# Patient Record
Sex: Male | Born: 1980 | Race: Black or African American | Hispanic: No | Marital: Single
Health system: Midwestern US, Community
[De-identification: ages and names within clinical notes are randomized; demographics above are authoritative.]

## PROBLEM LIST (undated history)

## (undated) HISTORY — PX: TIBIA FRACTURE SURGERY: SHX806

---

## 2014-06-01 ENCOUNTER — Emergency Department: Payer: Self-pay | Admitting: Emergency Medicine

## 2016-04-04 ENCOUNTER — Emergency Department: Payer: No Typology Code available for payment source

## 2016-04-04 ENCOUNTER — Encounter: Payer: Self-pay | Admitting: Emergency Medicine

## 2016-04-04 ENCOUNTER — Emergency Department
Admission: EM | Admit: 2016-04-04 | Discharge: 2016-04-04 | Disposition: A | Discharge status: Home / Self Care | Payer: No Typology Code available for payment source | Attending: Emergency Medicine | Admitting: Emergency Medicine

## 2016-04-04 DIAGNOSIS — S8001XA Contusion of right knee, initial encounter: Secondary | ICD-10-CM | POA: Diagnosis not present

## 2016-04-04 DIAGNOSIS — Y92410 Unspecified street and highway as the place of occurrence of the external cause: Secondary | ICD-10-CM | POA: Diagnosis not present

## 2016-04-04 DIAGNOSIS — F17200 Nicotine dependence, unspecified, uncomplicated: Secondary | ICD-10-CM | POA: Insufficient documentation

## 2016-04-04 DIAGNOSIS — Y939 Activity, unspecified: Secondary | ICD-10-CM | POA: Diagnosis not present

## 2016-04-04 DIAGNOSIS — Y999 Unspecified external cause status: Secondary | ICD-10-CM | POA: Diagnosis not present

## 2016-04-04 DIAGNOSIS — S8991XA Unspecified injury of right lower leg, initial encounter: Secondary | ICD-10-CM | POA: Diagnosis present

## 2016-04-04 MED ORDER — OXYCODONE-ACETAMINOPHEN 7.5-325 MG PO TABS
1.0000 | ORAL_TABLET | Freq: Once | ORAL | Status: AC
  Administered 2016-04-04: 1 via ORAL
  Filled 2016-04-04: qty 1

## 2016-04-04 MED ORDER — OXYCODONE-ACETAMINOPHEN 5-325 MG PO TABS: 1.0000 | ORAL_TABLET | Freq: Three times a day (TID) | ORAL | 0 refills | Status: AC | PRN

## 2016-04-04 NOTE — Discharge Instructions (Signed)
Your exam and x-ray does not reveal any obvious, new fracture. Continue to ambulate with crutches, rest with the leg elevated, and apply ice to reduce swelling. Follow-up with Dr. Luciano CutterHahn for further evaluation and management.
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
## 2016-04-15 ENCOUNTER — Encounter: Payer: Self-pay | Admitting: Emergency Medicine

## 2016-04-15 ENCOUNTER — Emergency Department
Admission: EM | Admit: 2016-04-15 | Discharge: 2016-04-15 | Disposition: A | Discharge status: Home / Self Care | Payer: No Typology Code available for payment source | Attending: Emergency Medicine | Admitting: Emergency Medicine

## 2016-04-15 ENCOUNTER — Emergency Department: Payer: No Typology Code available for payment source

## 2016-04-15 DIAGNOSIS — F17200 Nicotine dependence, unspecified, uncomplicated: Secondary | ICD-10-CM | POA: Diagnosis not present

## 2016-04-15 DIAGNOSIS — M7989 Other specified soft tissue disorders: Secondary | ICD-10-CM | POA: Diagnosis present

## 2016-04-15 DIAGNOSIS — M79604 Pain in right leg: Secondary | ICD-10-CM | POA: Diagnosis not present

## 2016-04-15 DIAGNOSIS — Z79899 Other long term (current) drug therapy: Secondary | ICD-10-CM | POA: Diagnosis not present

## 2016-04-15 DIAGNOSIS — T1490XA Injury, unspecified, initial encounter: Secondary | ICD-10-CM

## 2016-04-15 MED ORDER — OXYCODONE-ACETAMINOPHEN 5-325 MG PO TABS
1.0000 | ORAL_TABLET | Freq: Once | ORAL | Status: AC
  Administered 2016-04-15: 1 via ORAL
  Filled 2016-04-15: qty 1
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
## 2016-04-27 ENCOUNTER — Encounter: Payer: Self-pay | Admitting: Emergency Medicine

## 2016-04-27 ENCOUNTER — Emergency Department
Admission: EM | Admit: 2016-04-27 | Discharge: 2016-04-27 | Disposition: A | Discharge status: Home / Self Care | Payer: Self-pay | Attending: Emergency Medicine | Admitting: Emergency Medicine

## 2016-04-27 DIAGNOSIS — F17200 Nicotine dependence, unspecified, uncomplicated: Secondary | ICD-10-CM | POA: Insufficient documentation

## 2016-04-27 DIAGNOSIS — K529 Noninfective gastroenteritis and colitis, unspecified: Secondary | ICD-10-CM

## 2016-04-27 DIAGNOSIS — Z79899 Other long term (current) drug therapy: Secondary | ICD-10-CM | POA: Insufficient documentation

## 2016-04-27 LAB — COMPREHENSIVE METABOLIC PANEL
ALT: 18 U/L (ref 17–63)
ANION GAP: 7 (ref 5–15)
AST: 24 U/L (ref 15–41)
Albumin: 4.4 g/dL (ref 3.5–5.0)
Alkaline Phosphatase: 93 U/L (ref 38–126)
BUN: 7 mg/dL (ref 6–20)
CALCIUM: 9.3 mg/dL (ref 8.9–10.3)
CHLORIDE: 104 mmol/L (ref 101–111)
CO2: 26 mmol/L (ref 22–32)
CREATININE: 0.93 mg/dL (ref 0.61–1.24)
Glucose, Bld: 118 mg/dL — ABNORMAL HIGH (ref 65–99)
Potassium: 4.5 mmol/L (ref 3.5–5.1)
Sodium: 137 mmol/L (ref 135–145)
Total Bilirubin: 0.6 mg/dL (ref 0.3–1.2)
Total Protein: 7.9 g/dL (ref 6.5–8.1)

## 2016-04-27 LAB — CBC WITH DIFFERENTIAL/PLATELET
Basophils Absolute: 0.1 10*3/uL (ref 0–0.1)
Basophils Relative: 1 %
EOS PCT: 0 %
Eosinophils Absolute: 0 10*3/uL (ref 0–0.7)
HCT: 41.9 % (ref 40.0–52.0)
Hemoglobin: 14 g/dL (ref 13.0–18.0)
LYMPHS ABS: 1.2 10*3/uL (ref 1.0–3.6)
LYMPHS PCT: 9 %
MCH: 29.1 pg (ref 26.0–34.0)
MCHC: 33.5 g/dL (ref 32.0–36.0)
MCV: 86.8 fL (ref 80.0–100.0)
MONO ABS: 0.2 10*3/uL (ref 0.2–1.0)
MONOS PCT: 2 %
Neutro Abs: 11.9 10*3/uL — ABNORMAL HIGH (ref 1.4–6.5)
Neutrophils Relative %: 88 %
PLATELETS: 336 10*3/uL (ref 150–440)
RBC: 4.82 MIL/uL (ref 4.40–5.90)
RDW: 14.3 % (ref 11.5–14.5)
WBC: 13.5 10*3/uL — AB (ref 3.8–10.6)

## 2016-04-27 LAB — LIPASE, BLOOD: LIPASE: 19 U/L (ref 11–51)

## 2016-04-27 MED ORDER — ONDANSETRON HCL 4 MG PO TABS: 4.0000 mg | ORAL_TABLET | Freq: Three times a day (TID) | ORAL | 0 refills | Status: AC | PRN

## 2016-04-27 MED ORDER — SODIUM CHLORIDE 0.9 % IV BOLUS (SEPSIS)
1000.0000 mL | Freq: Once | INTRAVENOUS | Status: AC
  Administered 2016-04-27: 1000 mL via INTRAVENOUS

## 2016-04-27 MED ORDER — ONDANSETRON HCL 4 MG/2ML IJ SOLN
4.0000 mg | Freq: Once | INTRAMUSCULAR | Status: AC
  Administered 2016-04-27: 4 mg via INTRAVENOUS

## 2016-04-27 MED ORDER — DICYCLOMINE HCL 10 MG/ML IM SOLN
20.0000 mg | Freq: Once | INTRAMUSCULAR | Status: AC
  Administered 2016-04-27: 20 mg via INTRAMUSCULAR
  Filled 2016-04-27 (×2): qty 2

## 2016-04-27 MED ORDER — DICYCLOMINE HCL 20 MG PO TABS: 20.0000 mg | ORAL_TABLET | Freq: Three times a day (TID) | ORAL | 0 refills | Status: AC | PRN

## 2016-04-27 MED ORDER — ONDANSETRON HCL 4 MG/2ML IJ SOLN
INTRAMUSCULAR | Status: AC
  Filled 2016-04-27: qty 2

## 2016-04-27 MED ORDER — ONDANSETRON HCL 4 MG/2ML IJ SOLN
4.0000 mg | Freq: Once | INTRAMUSCULAR | Status: AC
  Administered 2016-04-27: 4 mg via INTRAVENOUS
  Filled 2016-04-27: qty 2

## 2016-04-27 NOTE — ED Notes (Signed)
Attempted IVx1. Unsuccessful.

## 2016-04-27 NOTE — ED Provider Notes (Signed)
[facility] Emergency Department Provider Note   ____________________________________________   I have reviewed the triage vital signs and the nursing notes.   HISTORY  Chief Complaint Abdominal Pain; Nausea; and Emesis   History limited by: Not Limited   HPI Shawn Lucas is a 35 y.o. male who presents to the emergency department today because of concerns for nausea vomiting and abdominal pain. The symptoms started roughly 6 hours ago. They did wake him from sleep. He has had multiple episodes of nonbloody vomiting. He has not had any associated diarrhea. He did have an episode of defecation which was normal to him. He is also describing somewhat diffuse abdominal pain that is sharp. He denies any known sick contacts. Denies any unusual ingestions.   History reviewed. No pertinent past medical history.  There are no active problems to display for this patient.   Past Surgical History:  Procedure Laterality Date  . TIBIA FRACTURE SURGERY      Prior to Admission medications   Medication Sig Start Date End Date Taking? Authorizing Provider  oxyCODONE-acetaminophen (ROXICET) 5-325 MG tablet Take 1 tablet by mouth every 8 (eight) hours as needed for moderate pain or severe pain. 04/04/16   Charlesetta IvoryJenise V Bacon Menshew, PA-C    Allergies Patient has no known allergies.  No family history on file.  Social History Social History  Substance Use Topics  . Smoking status: Current Every Day Smoker  . Smokeless tobacco: Never Used  . Alcohol use No    Review of Systems  Constitutional: Negative for fever. Cardiovascular: Negative for chest pain. Respiratory: Negative for shortness of breath. Gastrointestinal: Positive for abdominal pain, nausea and vomiting. Genitourinary: Negative for dysuria. Musculoskeletal: Negative for back pain. Skin: Negative for rash. Neurological: Negative for headaches, focal weakness or numbness.  10-point ROS otherwise  negative.  ____________________________________________   PHYSICAL EXAM:  VITAL SIGNS: ED Triage Vitals [04/27/16 0901]  Enc Vitals Group     BP (!) 151/79     Pulse Rate 64     Resp 18     Temp 97.5 F (36.4 C)     Temp Source Oral     SpO2 100 %     Weight 175 lb (79.4 kg)     Height 5\' 9"  (1.753 m)     Head Circumference      Peak Flow      Pain Score 10    Constitutional: Alert and oriented. Well appearing and in no distress. Eyes: Conjunctivae are normal. Normal extraocular movements. ENT   Head: Normocephalic and atraumatic.   Nose: No congestion/rhinnorhea.   Mouth/Throat: Mucous membranes are moist.   Neck: No stridor. Hematological/Lymphatic/Immunilogical: No cervical lymphadenopathy. Cardiovascular: Normal rate, regular rhythm.  No murmurs, rubs, or gallops. Respiratory: Normal respiratory effort without tachypnea nor retractions. Breath sounds are clear and equal bilaterally. No wheezes/rales/rhonchi. Gastrointestinal: Soft and non tender. No rebound. No guarding.  Genitourinary: Deferred Musculoskeletal: Normal range of motion in all extremities. No lower extremity edema. Neurologic:  Normal speech and language. No gross focal neurologic deficits are appreciated.  Skin:  Skin is warm, dry and intact. No rash noted. Psychiatric: Mood and affect are normal. Speech and behavior are normal. Patient exhibits appropriate insight and judgment.  ____________________________________________    LABS (pertinent positives/negatives)  Labs Reviewed  CBC WITH DIFFERENTIAL/PLATELET - Abnormal; Notable for the following:       Result Value   WBC 13.5 (*)    Neutro Abs 11.9 (*)  All other components within normal limits  COMPREHENSIVE METABOLIC PANEL - Abnormal; Notable for the following:    Glucose, Bld 118 (*)    All other components within normal limits  LIPASE, BLOOD      ____________________________________________   EKG  None  ____________________________________________    RADIOLOGY  None  ____________________________________________   PROCEDURES  Procedures  ____________________________________________   INITIAL IMPRESSION / ASSESSMENT AND PLAN / ED COURSE  Pertinent labs & imaging results that were available during my care of the patient were reviewed by me and considered in my medical decision making (see chart for details).  Patient presented to the emergency department today with concerns for nausea vomiting and abdominal pain. Patient's blood work without any concerning elevation of Function panel, lipase were white blood cells. Patient did feel better after IV fluids and antiemetics. At this point think viral gastroenteritis likely. I doubt significant intra-abdominal infection or complication. Will plan on discharging home with Bentyl and Zofran.  ____________________________________________   FINAL CLINICAL IMPRESSION(S) / ED DIAGNOSES  Final diagnoses:  Gastroenteritis     Note: This dictation was prepared with Dragon dictation. Any transcriptional errors that result from this process are unintentional     Phineas SemenGraydon Adalynd Donahoe, MD 04/27/16 1237

## 2016-04-27 NOTE — ED Triage Notes (Signed)
Pt presents with abd pain, n/v started last night.

## 2016-04-27 NOTE — Discharge Instructions (Signed)
Please seek medical attention for any high fevers, chest pain, shortness of breath, change in behavior, persistent vomiting, bloody stool or any other new or concerning symptoms.  

## 2016-04-27 NOTE — ED Notes (Signed)
Offered to wheel patient out, wife declined said she would be fine.

## 2016-04-27 NOTE — ED Notes (Signed)
Redone lab collection. Sent labs down.

## 2016-08-27 ENCOUNTER — Emergency Department
Admission: EM | Admit: 2016-08-27 | Discharge: 2016-08-27 | Disposition: A | Discharge status: Home / Self Care | Payer: Self-pay | Attending: Emergency Medicine | Admitting: Emergency Medicine

## 2016-08-27 ENCOUNTER — Encounter: Payer: Self-pay | Admitting: Emergency Medicine

## 2016-08-27 ENCOUNTER — Emergency Department: Payer: Self-pay

## 2016-08-27 DIAGNOSIS — F17200 Nicotine dependence, unspecified, uncomplicated: Secondary | ICD-10-CM | POA: Insufficient documentation

## 2016-08-27 DIAGNOSIS — K529 Noninfective gastroenteritis and colitis, unspecified: Secondary | ICD-10-CM | POA: Insufficient documentation

## 2016-08-27 LAB — CBC
HCT: 41.6 % (ref 40.0–52.0)
Hemoglobin: 13.7 g/dL (ref 13.0–18.0)
MCH: 29.7 pg (ref 26.0–34.0)
MCHC: 33 g/dL (ref 32.0–36.0)
MCV: 89.8 fL (ref 80.0–100.0)
PLATELETS: 423 10*3/uL (ref 150–440)
RBC: 4.63 MIL/uL (ref 4.40–5.90)
RDW: 13.2 % (ref 11.5–14.5)
WBC: 15.1 10*3/uL — ABNORMAL HIGH (ref 3.8–10.6)

## 2016-08-27 LAB — COMPREHENSIVE METABOLIC PANEL
ALBUMIN: 4.1 g/dL (ref 3.5–5.0)
ALK PHOS: 100 U/L (ref 38–126)
ALT: 26 U/L (ref 17–63)
ANION GAP: 11 (ref 5–15)
AST: 27 U/L (ref 15–41)
BUN: 9 mg/dL (ref 6–20)
CHLORIDE: 102 mmol/L (ref 101–111)
CO2: 25 mmol/L (ref 22–32)
CREATININE: 1.03 mg/dL (ref 0.61–1.24)
Calcium: 9.3 mg/dL (ref 8.9–10.3)
GFR calc non Af Amer: >60 mL/min (ref >60)
Glucose, Bld: 156 mg/dL — ABNORMAL HIGH (ref 65–99)
POTASSIUM: 3.7 mmol/L (ref 3.5–5.1)
SODIUM: 138 mmol/L (ref 135–145)
TOTAL PROTEIN: 7.5 g/dL (ref 6.5–8.1)
Total Bilirubin: 0.6 mg/dL (ref 0.3–1.2)

## 2016-08-27 LAB — URINALYSIS, COMPLETE (UACMP) WITH MICROSCOPIC
Bilirubin Urine: NEGATIVE
GLUCOSE, UA: NEGATIVE mg/dL
Hgb urine dipstick: NEGATIVE
Ketones, ur: 20 mg/dL — AB
Nitrite: NEGATIVE
PH: 7 (ref 5.0–8.0)
PROTEIN: 30 mg/dL — AB
Specific Gravity, Urine: 1.024 (ref 1.005–1.030)
Squamous Epithelial / LPF: NONE SEEN

## 2016-08-27 LAB — LIPASE, BLOOD: LIPASE: 38 U/L (ref 11–51)

## 2016-08-27 LAB — TROPONIN I: Troponin I: <0.03 ng/mL (ref <0.03)

## 2016-08-27 MED ORDER — ONDANSETRON HCL 4 MG/2ML IJ SOLN
4.0000 mg | Freq: Once | INTRAMUSCULAR | Status: AC | PRN
  Administered 2016-08-27: 4 mg via INTRAVENOUS

## 2016-08-27 MED ORDER — MORPHINE SULFATE (PF) 4 MG/ML IV SOLN
INTRAVENOUS | Status: DC
Start: 2016-08-27 — End: 2016-08-27
  Filled 2016-08-27: qty 1

## 2016-08-27 MED ORDER — IOPAMIDOL (ISOVUE-300) INJECTION 61%
100.0000 mL | Freq: Once | INTRAVENOUS | Status: AC | PRN
  Administered 2016-08-27: 100 mL via INTRAVENOUS

## 2016-08-27 MED ORDER — CIPROFLOXACIN HCL 500 MG PO TABS: 500.0000 mg | ORAL_TABLET | Freq: Two times a day (BID) | ORAL | 0 refills | Status: AC

## 2016-08-27 MED ORDER — SODIUM CHLORIDE 0.9 % IV BOLUS (SEPSIS)
1000.0000 mL | Freq: Once | INTRAVENOUS | Status: AC
  Administered 2016-08-27: 1000 mL via INTRAVENOUS

## 2016-08-27 MED ORDER — OXYCODONE-ACETAMINOPHEN 5-325 MG PO TABS: 1.0000 | ORAL_TABLET | Freq: Four times a day (QID) | ORAL | 0 refills | Status: AC | PRN

## 2016-08-27 MED ORDER — METRONIDAZOLE IN NACL 5-0.79 MG/ML-% IV SOLN
500.0000 mg | Freq: Once | INTRAVENOUS | Status: AC
  Administered 2016-08-27: 500 mg via INTRAVENOUS
  Filled 2016-08-27: qty 100

## 2016-08-27 MED ORDER — OXYCODONE-ACETAMINOPHEN 5-325 MG PO TABS
1.0000 | ORAL_TABLET | Freq: Once | ORAL | Status: AC
Start: 2016-08-27 — End: 2016-08-27
  Administered 2016-08-27: 1 via ORAL
  Filled 2016-08-27: qty 1

## 2016-08-27 MED ORDER — CIPROFLOXACIN IN D5W 400 MG/200ML IV SOLN
400.0000 mg | Freq: Once | INTRAVENOUS | Status: AC
  Administered 2016-08-27: 400 mg via INTRAVENOUS
  Filled 2016-08-27: qty 200

## 2016-08-27 MED ORDER — IOPAMIDOL (ISOVUE-300) INJECTION 61%
30.0000 mL | Freq: Once | INTRAVENOUS | Status: AC
  Administered 2016-08-27: 30 mL via ORAL

## 2016-08-27 MED ORDER — METRONIDAZOLE 500 MG PO TABS
500.0000 mg | ORAL_TABLET | Freq: Two times a day (BID) | ORAL | 0 refills | Status: AC
Start: 2016-08-27 — End: 2016-09-03

## 2016-08-27 MED ORDER — MORPHINE SULFATE (PF) 4 MG/ML IV SOLN
4.0000 mg | Freq: Once | INTRAVENOUS | Status: AC
  Administered 2016-08-27: 4 mg via INTRAVENOUS

## 2016-08-27 MED ORDER — ONDANSETRON HCL 4 MG/2ML IJ SOLN
INTRAMUSCULAR | Status: AC
  Filled 2016-08-27: qty 2

## 2016-08-27 NOTE — ED Triage Notes (Signed)
Abdominal pain for 4 days, worse over the past 5 hours.  +nausea, vomiting and diarrhea.

## 2016-08-27 NOTE — ED Provider Notes (Signed)
[facility] Emergency Department Provider Note   ____________________________________________   First MD Initiated Contact with Patient 08/27/16 [phone]     (approximate)  I have reviewed the triage vital signs and the nursing notes.   HISTORY  Chief Complaint Abdominal Pain    HPI Shawn Lucas is a 36 y.o. male who comes into the hospital today with abdominal pain for the past 4 days. The patient has had pain along with vomiting and diarrhea. It is all over his abdomen. He reports that it seems worse today. He felt like his chest was going to [place] in and stated that his chest was feeling tight when he came in here tonight. The patient has not had any fevers but did break into a sweat. Now the patient is having some shaking chills. He states that the pain varies but currently it is a 10 out of 10 in intensity. The patient has never had this pain before and denies any sick contacts. He recently started on an anti-inflammatory medication that they do not remember the name of. He has not taken any other medication at home. He is here today for evaluation.   History reviewed. No pertinent past medical history.  There are no active problems to display for this patient.   Past Surgical History:  Procedure Laterality Date  . TIBIA FRACTURE SURGERY      Prior to Admission medications   Medication Sig Start Date End Date Taking? Authorizing Provider  ciprofloxacin (CIPRO) 500 MG tablet Take 1 tablet (500 mg total) by mouth 2 (two) times daily. 08/27/16 09/03/16  Rebecka Apley, MD  dicyclomine (BENTYL) 20 MG tablet Take 1 tablet (20 mg total) by mouth 3 (three) times daily as needed (abdominal pain). 04/27/16   Phineas Semen, MD  metroNIDAZOLE (FLAGYL) 500 MG tablet Take 1 tablet (500 mg total) by mouth 2 (two) times daily. 08/27/16 09/03/16  Rebecka Apley, MD  ondansetron (ZOFRAN) 4 MG tablet Take 1 tablet (4 mg total) by mouth every 8 (eight) hours as  needed for nausea or vomiting. 04/27/16   Phineas Semen, MD  oxyCODONE-acetaminophen (ROXICET) 5-325 MG tablet Take 1 tablet by mouth every 8 (eight) hours as needed for moderate pain or severe pain. 04/04/16   Jenise V Bacon Menshew, PA-C  oxyCODONE-acetaminophen (ROXICET) 5-325 MG tablet Take 1 tablet by mouth every 6 (six) hours as needed. 08/27/16   Rebecka Apley, MD    Allergies Patient has no known allergies.  No family history on file.  Social History Social History  Substance Use Topics  . Smoking status: Current Every Day Smoker    Packs/day: 0.50  . Smokeless tobacco: Never Used  . Alcohol use No    Review of Systems  Constitutional: No fever/chills Eyes: No visual changes. ENT: No sore throat. Cardiovascular: Denies chest pain. Respiratory: Denies shortness of breath. Gastrointestinal:  abdominal pain.   nausea,  vomiting.   diarrhea.  No constipation. Genitourinary: Negative for dysuria. Musculoskeletal: Negative for back pain. Skin: Negative for rash. Neurological: Negative for headaches, focal weakness or numbness.   ____________________________________________   PHYSICAL EXAM:  VITAL SIGNS: ED Triage Vitals  Enc Vitals Group     BP 08/27/16 0233 (!) 145/107     Pulse Rate 08/27/16 0201 72     Resp 08/27/16 0201 (!) 26     Temp 08/27/16 0201 97.4 F (36.3 C)     Temp Source 08/27/16 0201 Oral     SpO2 08/27/16  0201 100 %     Weight 08/27/16 0208 270 lb (122.5 kg)     Height 08/27/16 0208  (1.753 m)     Head Circumference --      Peak Flow --      Pain Score --      Pain Loc --      Pain Edu? --      Excl. in GC? --     Constitutional: Alert and oriented. Well appearing and in Moderate distress. Eyes: Conjunctivae are normal. PERRL. EOMI. Head: Atraumatic. Nose: No congestion/rhinnorhea. Mouth/Throat: Mucous membranes are moist.  Oropharynx non-erythematous. Cardiovascular: Normal rate, regular rhythm. Grossly normal heart sounds.   Good peripheral circulation. Respiratory: Normal respiratory effort.  No retractions. Lungs CTAB. Gastrointestinal: Soft and nontender. No distention. Positive bowel sounds Musculoskeletal: No lower extremity tenderness nor edema.   Neurologic:  Normal speech and language.  Skin:  Skin is warm, dry and intact. Marland Kitchen Psychiatric: Mood and affect are normal.   ____________________________________________   LABS (all labs ordered are listed, but only abnormal results are displayed)  Labs Reviewed  COMPREHENSIVE METABOLIC PANEL - Abnormal; Notable for the following:       Result Value   Glucose, Bld 156 (*)    All other components within normal limits  CBC - Abnormal; Notable for the following:    WBC 15.1 (*)    All other components within normal limits  URINALYSIS, COMPLETE (UACMP) WITH MICROSCOPIC - Abnormal; Notable for the following:    Color, Urine YELLOW (*)    APPearance TURBID (*)    Ketones, ur 20 (*)    Protein, ur 30 (*)    Leukocytes, UA TRACE (*)    Bacteria, UA RARE (*)    All other components within normal limits  LIPASE, BLOOD  TROPONIN I   ____________________________________________  EKG  ED ECG REPORT I, Rebecka Apley, the attending physician, personally viewed and interpreted this ECG.   Date: 08/27/2016  EKG Time: 208  Rate: 73  Rhythm: normal sinus rhythm  Axis: normal  Intervals:none  ST&T Change: none  ____________________________________________  RADIOLOGY  CT abdomen and pelvis ____________________________________________   PROCEDURES  Procedure(s) performed: None  Procedures  Critical Care performed: No  ____________________________________________   INITIAL IMPRESSION / ASSESSMENT AND PLAN / ED COURSE  Pertinent labs & imaging results that were available during my care of the patient were reviewed by me and considered in my medical decision making (see chart for details).  This is a 36 year old male who comes into the  hospital today with some abdominal pain. The patient is also had some vomiting and diarrhea. He is very uncomfortable and did receive some morphine and Zofran when he initially arrived. I will give the patient a second dose of morphine as well as a liter of normal saline. The patient has a white blood cell count of 15 so I will send him for a CT scan to evaluate the cause of his pain.  Clinical Course as of Aug 27 699  Sun Aug 27, 2016  0515 Colitis, likely infectious in etiology. Correlation with clinical exam and stool cultures recommended. No bowel obstruction. Normal appendix.   CT Abdomen Pelvis W Contrast [AW]    Clinical Course User Index [AW] Rebecka Apley, MD   The patient did receive some ciprofloxacin and Flagyl for his colitis. He also received a dose of Percocet orally. He'll be discharged home to follow-up with his primary care physician or the  acute care clinic.  ____________________________________________   FINAL CLINICAL IMPRESSION(S) / ED DIAGNOSES  Final diagnoses:  Colitis      NEW MEDICATIONS STARTED DURING THIS VISIT:  New Prescriptions   CIPROFLOXACIN (CIPRO) 500 MG TABLET    Take 1 tablet (500 mg total) by mouth 2 (two) times daily.   METRONIDAZOLE (FLAGYL) 500 MG TABLET    Take 1 tablet (500 mg total) by mouth 2 (two) times daily.   OXYCODONE-ACETAMINOPHEN (ROXICET) 5-325 MG TABLET    Take 1 tablet by mouth every 6 (six) hours as needed.     Note:  This document was prepared using Dragon voice recognition software and may include unintentional dictation errors.    Rebecka Apley, MD 08/27/16 [phone]

## 2016-08-27 NOTE — ED Notes (Signed)
Unable to obtain blood pressure because patient unable to stay still.

## 2016-08-27 NOTE — Discharge Instructions (Signed)
Please follow up with the acute care clinic °

## 2017-07-18 IMAGING — CT CT ABD-PELV W/ CM
2 of 7 series · 14 of 46 positions shown, 18 images · IV contrast (APPLIED)
Comparison: None.

CLINICAL DATA: 35-year-old male with diffuse abdominal pain and
vomiting and diarrhea.

EXAM:
CT ABDOMEN AND PELVIS WITH CONTRAST
TECHNIQUE: Multidetector CT imaging of the abdomen and pelvis was performed
using the standard protocol following bolus administration of
intravenous contrast.
CONTRAST:  100mL [id number] IOPAMIDOL ([id number]) INJECTION 61%

[Series 2: routine abd/pel with · axial · 0.88mm/px · z∈[-936,-516]mm · 11 of 98 slices shown, 15 images]
[im 9/98  soft-tissue]
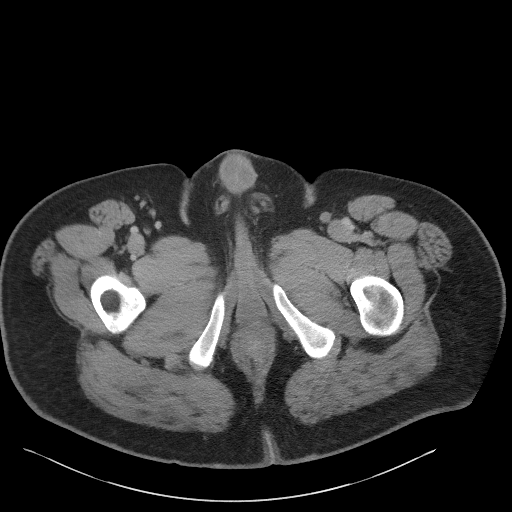
[im 9/98  bone]
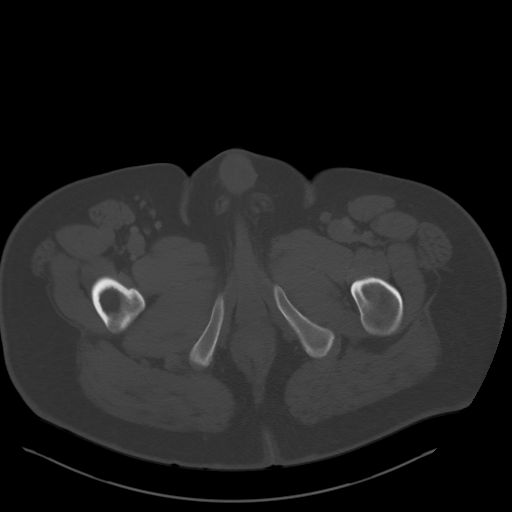
[im 18/98  soft-tissue]
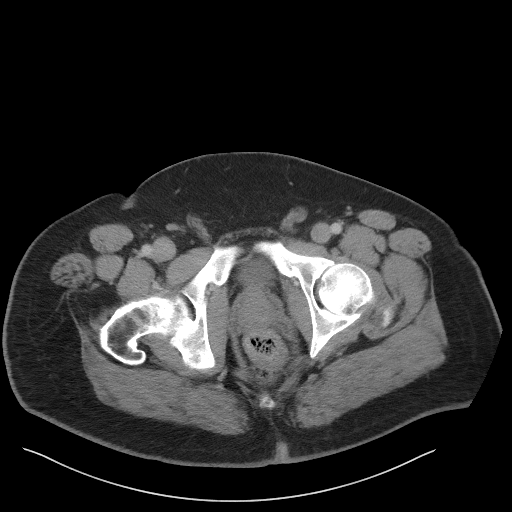
[im 27/98  soft-tissue]
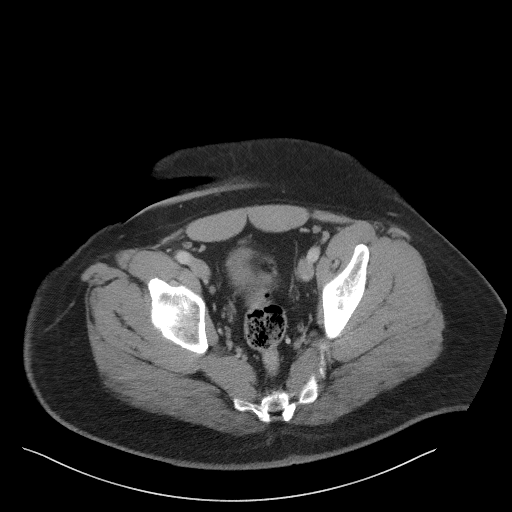
[im 40/98  soft-tissue]
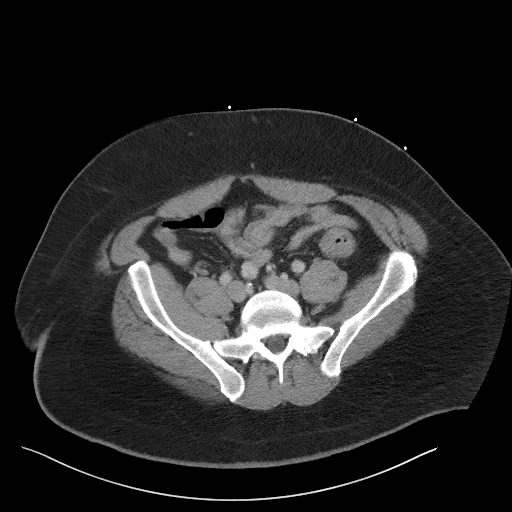
[im 49/98  soft-tissue]
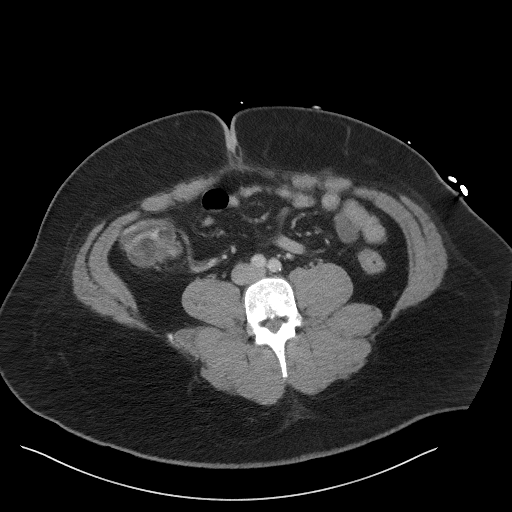
[im 58/98  soft-tissue]
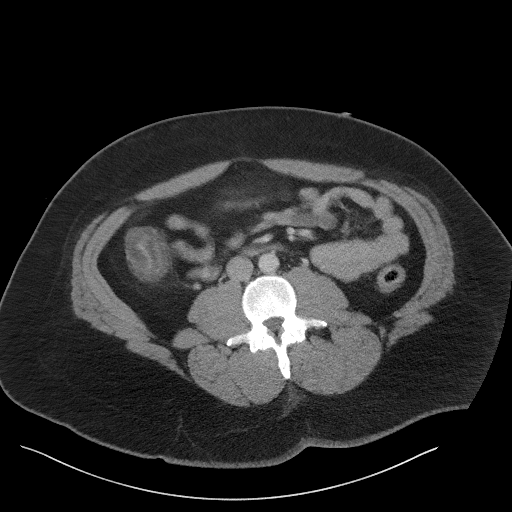
[im 71/98  soft-tissue]
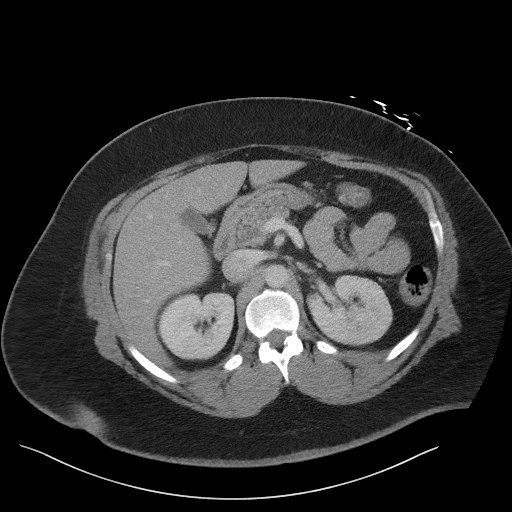
[im 80/98  soft-tissue]
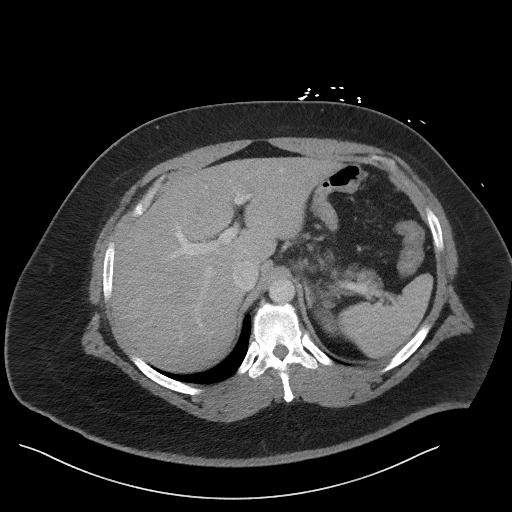
[im 80/98  lung]
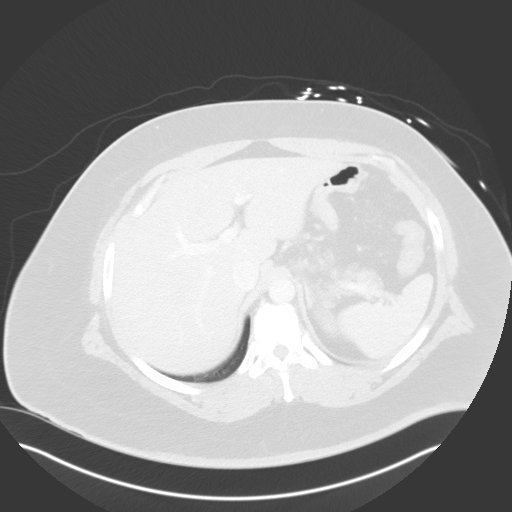
[im 84/98  lung]
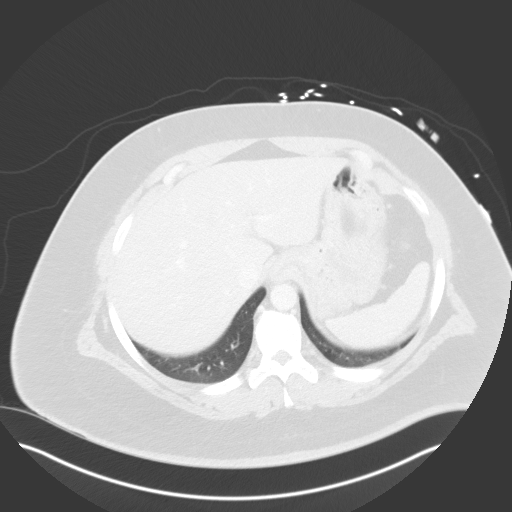
[im 89/98  soft-tissue]
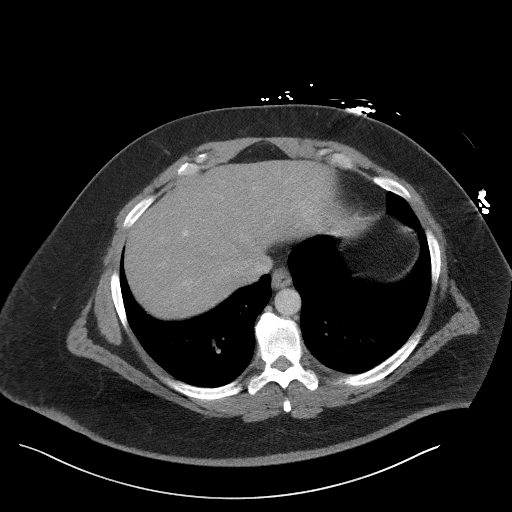
[im 89/98  lung]
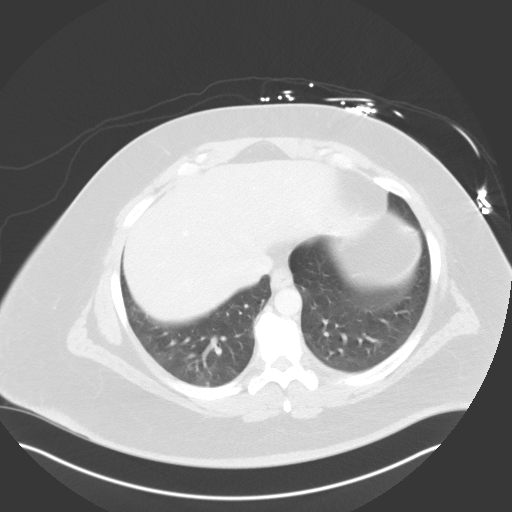
[im 89/98  bone]
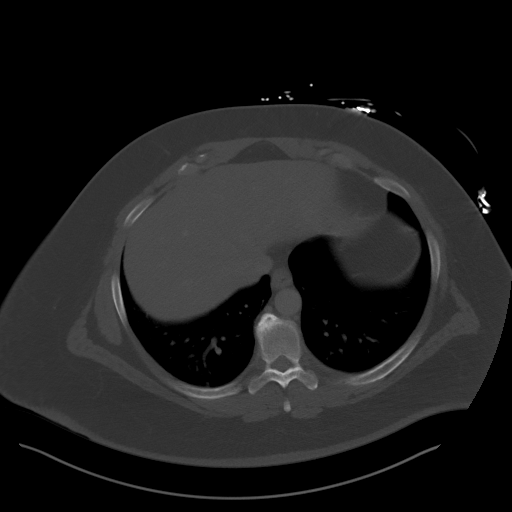
[im 93/98  lung]
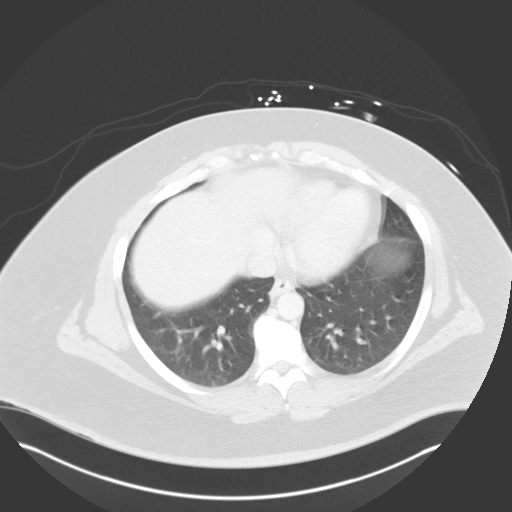

[Series 7: coronal st · coronal · 0.78mm/px · 3 of 101 slices shown]
[im 26/101  soft-tissue]
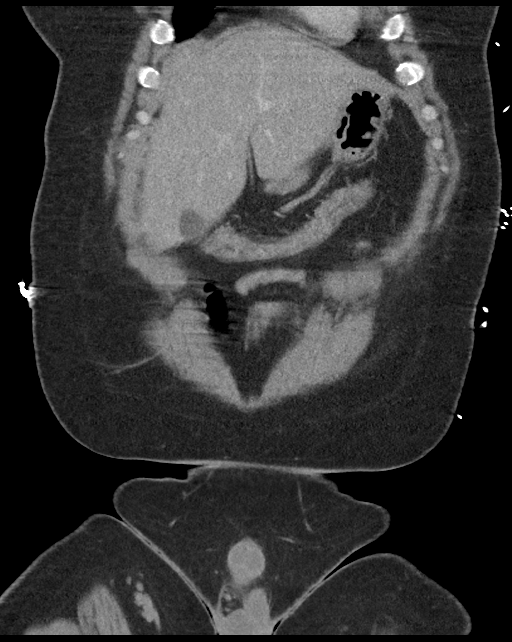
[im 51/101  soft-tissue]
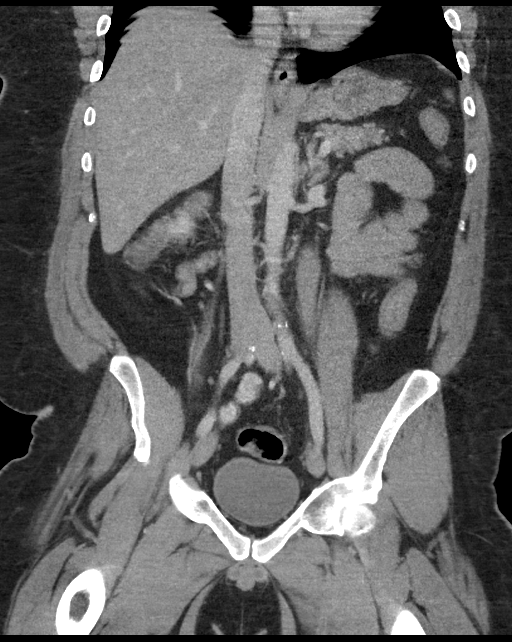
[im 76/101  soft-tissue]
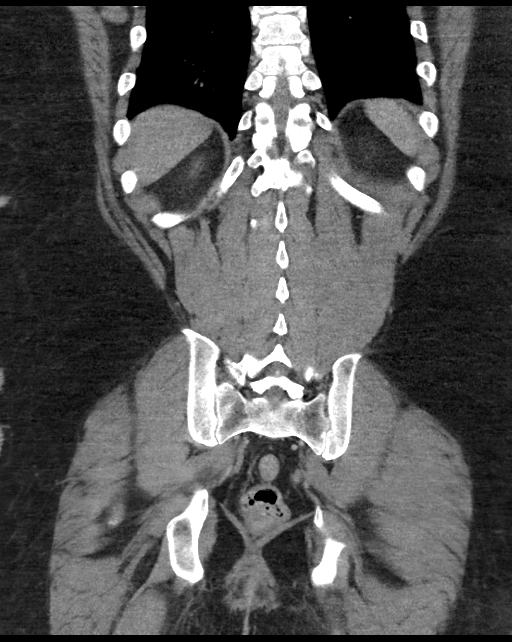

[14 of 46 positions shown; findings below may reference images not displayed]

FINDINGS: Lower chest: The visualized lung bases are clear.

No intra-abdominal free air or free fluid.

Hepatobiliary: Mild diffuse fatty infiltration of the liver. No
intrahepatic biliary ductal dilatation. The gallbladder is
unremarkable.

Pancreas: Unremarkable. No pancreatic ductal dilatation or
surrounding inflammatory changes.

Spleen: Normal in size without focal abnormality.

Adrenals/Urinary Tract: Adrenal glands are unremarkable. Kidneys are
normal, without renal calculi, focal lesion, or hydronephrosis.
Bladder is unremarkable.

Stomach/Bowel: There is diffuse thickening of the colon primarily
involving the ascending and transverse portion of the colon most
consistent with colitis, likely of an infectious etiology. Small
scattered colonic diverticula without active inflammatory changes
noted. There is no evidence of bowel obstruction. Normal appendix.

Vascular/Lymphatic: No significant vascular findings are present. No
enlarged abdominal or pelvic lymph nodes.

Reproductive: The prostate and seminal vesicles are grossly
unremarkable.

Other: None

Musculoskeletal: No acute or significant osseous findings.
IMPRESSION: Colitis, likely infectious in etiology. Correlation with clinical
exam and stool cultures recommended. No bowel obstruction. Normal
appendix.

## 2018-09-09 IMAGING — US US EXTREM LOW VENOUS*R*
1 series · 13 of 24 positions shown · non-contrast
Comparison: None.

CLINICAL DATA: Right lower extremity pain and edema.



[Series 1: us extrem low venous*right* · 0.08mm/px · 13 of 47 slices shown]
[im 1/47]
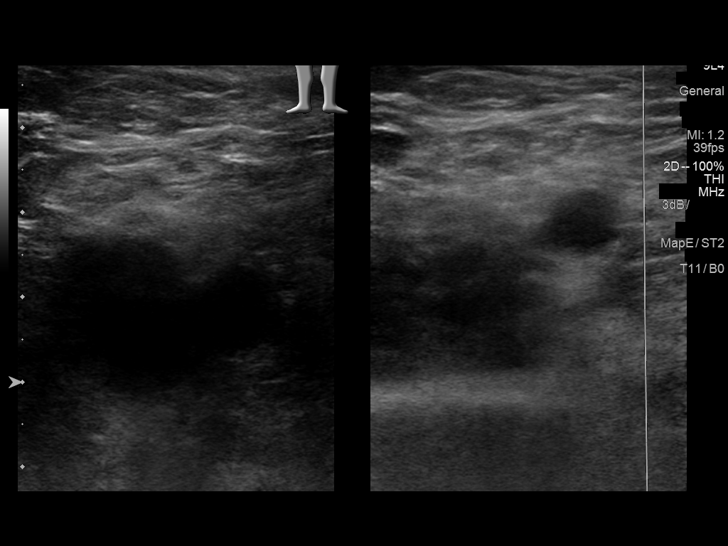
[im 5/47]
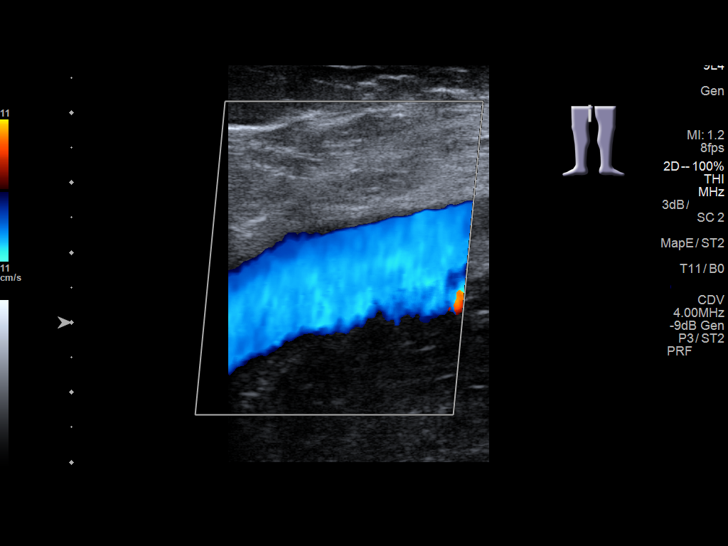
[im 9/47]
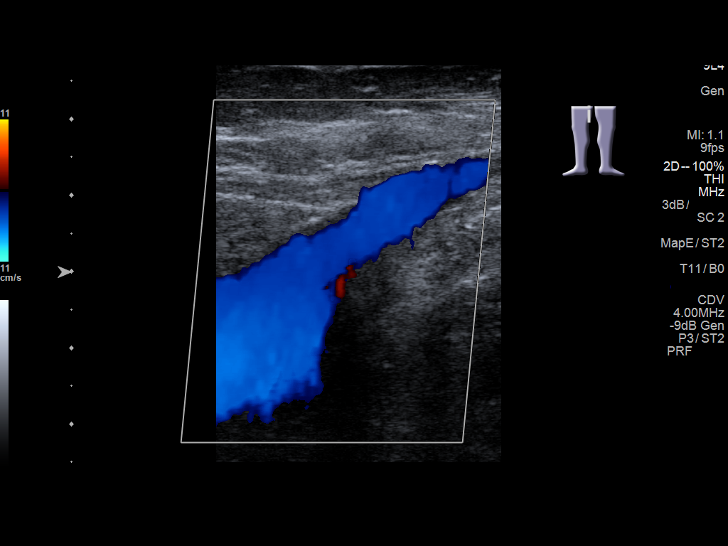
[im 13/47]
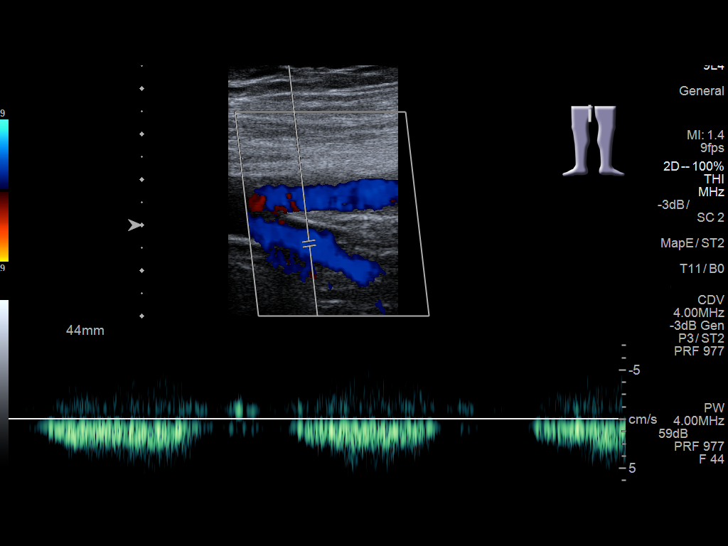
[im 17/47]
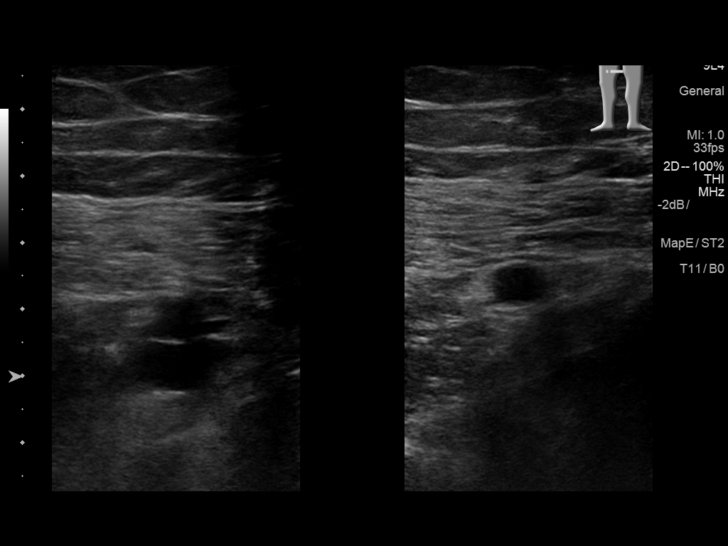
[im 21/47]
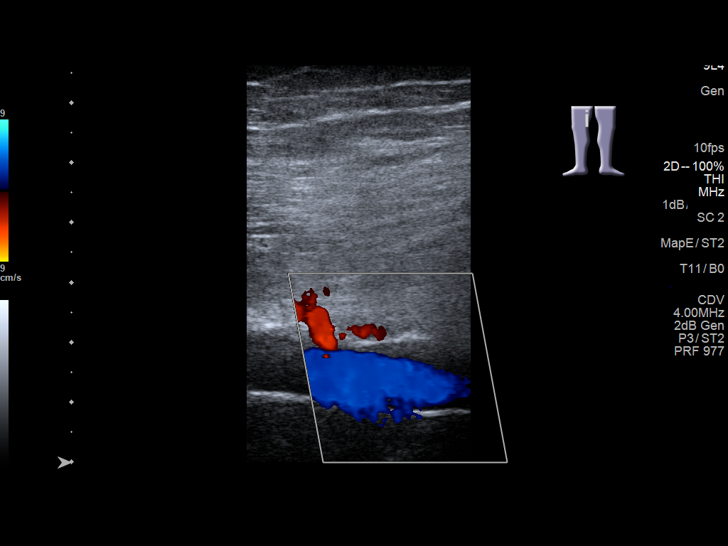
[im 25/47]
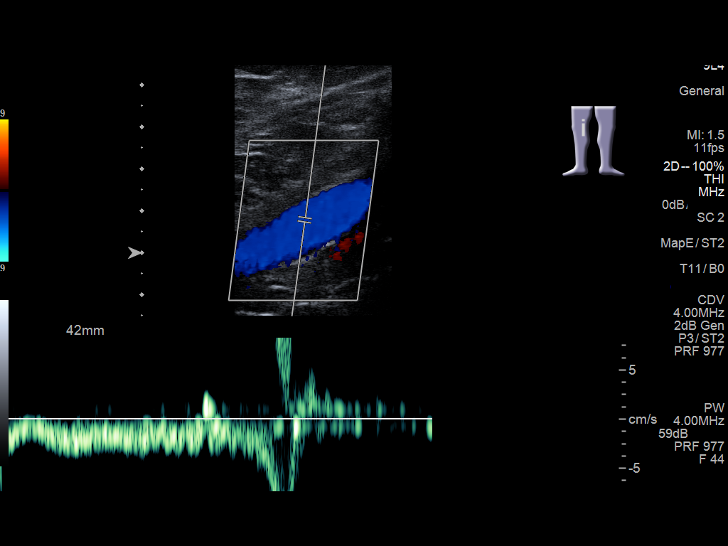
[im 27/47]
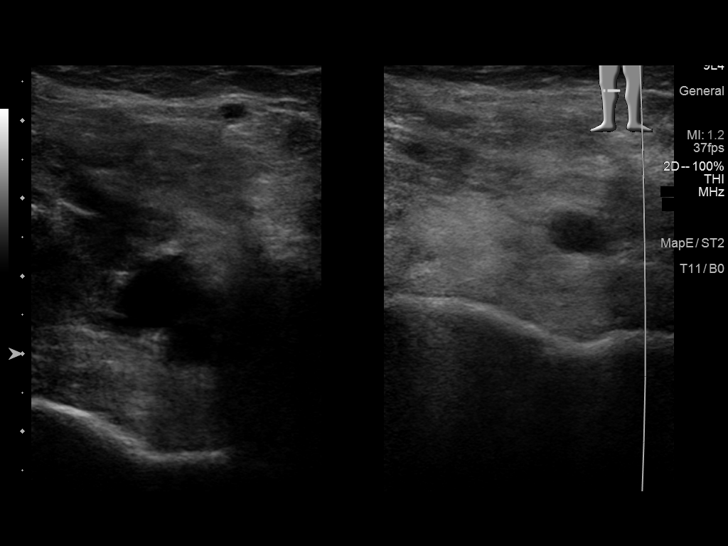
[im 31/47]
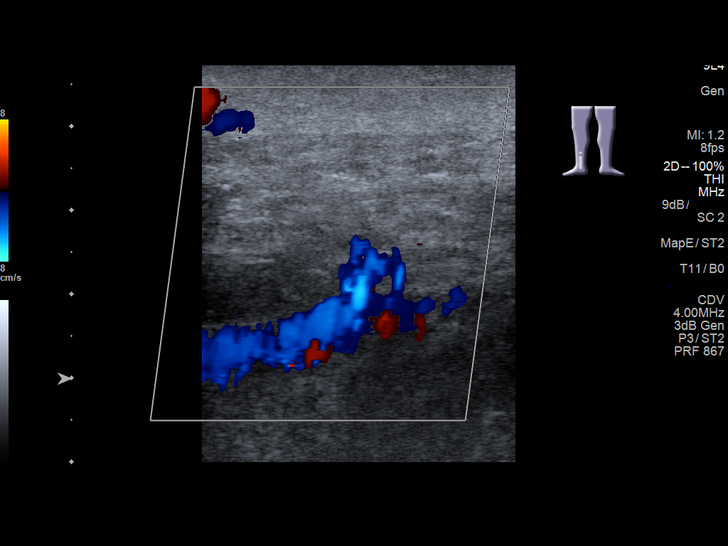
[im 35/47]
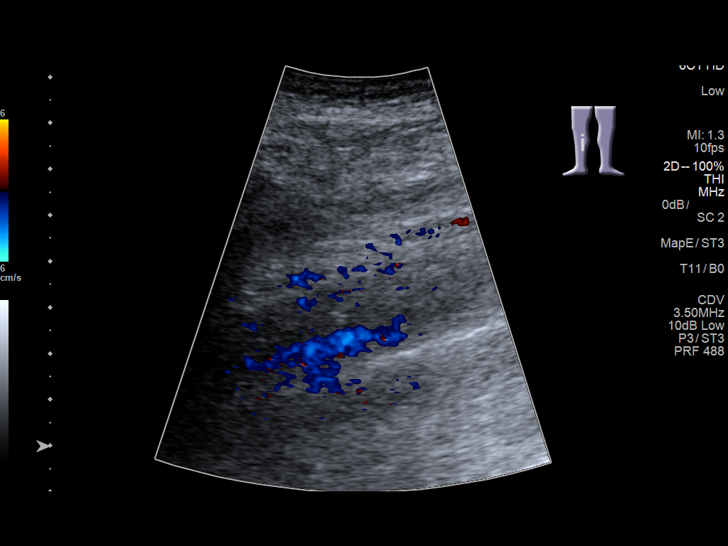
[im 39/47]
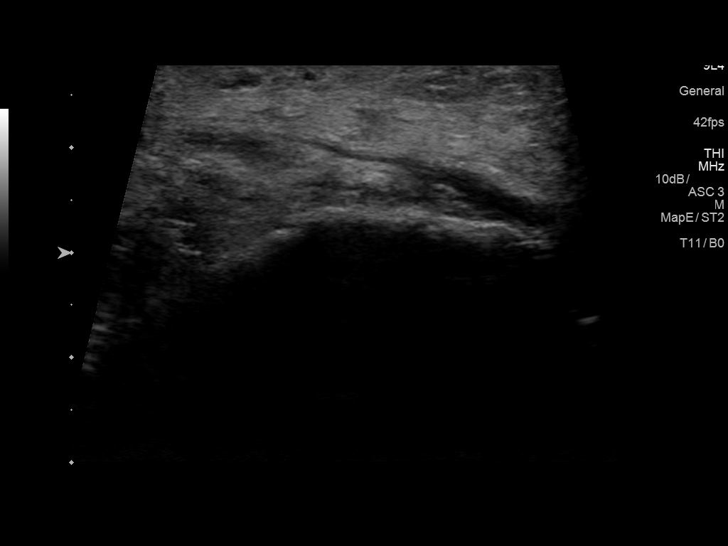
[im 43/47]
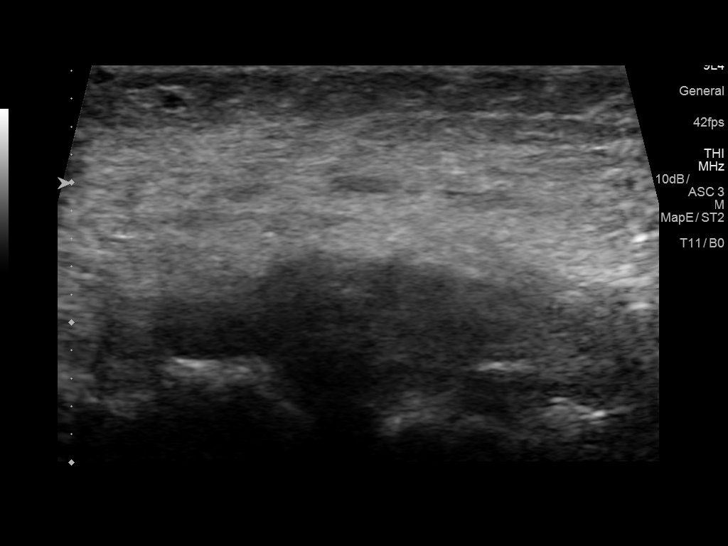
[im 47/47]
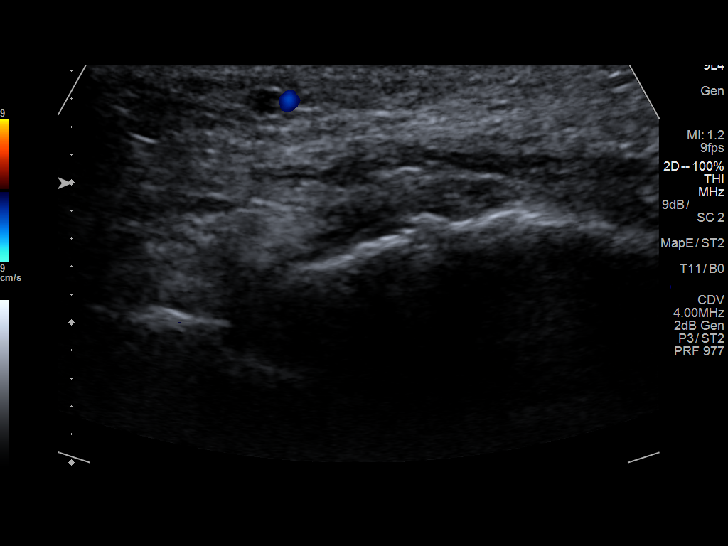

[13 of 24 positions shown; findings below may reference images not displayed]

FINDINGS: Contralateral Common Femoral Vein: Respiratory phasicity is normal
and symmetric with the symptomatic side. No evidence of thrombus.
Normal compressibility.

Common Femoral Vein: No evidence of thrombus. Normal
compressibility, respiratory phasicity and response to augmentation.

Saphenofemoral Junction: No evidence of thrombus. Normal
compressibility and flow on color Doppler imaging.

Profunda Femoral Vein: No evidence of thrombus. Normal
compressibility and flow on color Doppler imaging.

Femoral Vein: No evidence of thrombus. Normal compressibility,
respiratory phasicity and response to augmentation.

Popliteal Vein: No evidence of thrombus. Normal compressibility,
respiratory phasicity and response to augmentation.

Calf Veins: No evidence of thrombus. Normal compressibility and flow
on color Doppler imaging.

Superficial Great Saphenous Vein: No evidence of thrombus. Normal
compressibility and flow on color Doppler imaging.

Venous Reflux:  None.

Other Findings: No evidence of superficial thrombophlebitis or
abnormal fluid collection.
IMPRESSION: No evidence of right lower extremity deep venous thrombosis.

## 2019-10-06 ENCOUNTER — Inpatient Hospital Stay
Admit: 2019-10-06 | Discharge: 2019-10-06 | Disposition: A | Discharge status: Home / Self Care | Payer: MEDICAID | Attending: Emergency Medicine

## 2019-10-06 DIAGNOSIS — R197 Diarrhea, unspecified: Secondary | ICD-10-CM

## 2019-10-06 LAB — CBC WITH AUTO DIFFERENTIAL
Basophils %: 0 % (ref 0.0–2.0)
Basophils Absolute: 0 10*3/uL (ref 0.0–0.2)
Eosinophils %: 0 % — ABNORMAL LOW (ref 0.5–7.8)
Eosinophils Absolute: 0 10*3/uL (ref 0.0–0.8)
Granulocyte Absolute Count: 0.1 10*3/uL (ref 0.0–0.5)
Hematocrit: 44.9 % (ref 41.1–50.3)
Hemoglobin: 14.9 g/dL (ref 13.6–17.2)
Immature Granulocytes: 1 % (ref 0.0–5.0)
Lymphocytes %: 10 % — ABNORMAL LOW (ref 13–44)
Lymphocytes Absolute: 1.7 10*3/uL (ref 0.5–4.6)
MCH: 29.9 PG (ref 26.1–32.9)
MCHC: 33.2 g/dL (ref 31.4–35.0)
MCV: 90 FL (ref 79.6–97.8)
MPV: 10.3 FL (ref 9.4–12.3)
Monocytes %: 2 % — ABNORMAL LOW (ref 4.0–12.0)
Monocytes Absolute: 0.4 10*3/uL (ref 0.1–1.3)
NRBC Absolute: 0 10*3/uL (ref 0.0–0.2)
Neutrophils %: 87 % — ABNORMAL HIGH (ref 43–78)
Neutrophils Absolute: 14.6 10*3/uL — ABNORMAL HIGH (ref 1.7–8.2)
Platelets: 406 10*3/uL (ref 150–450)
RBC: 4.99 M/uL (ref 4.23–5.6)
RDW: 12.8 % (ref 11.9–14.6)
WBC: 16.7 10*3/uL — ABNORMAL HIGH (ref 4.3–11.1)

## 2019-10-06 LAB — MAGNESIUM
Magnesium: 2.2 mg/dL (ref 1.8–2.4)
Magnesium: 2.2 mg/dL (ref 1.8–2.4)

## 2019-10-06 LAB — COMPREHENSIVE METABOLIC PANEL
ALT: 35 U/L (ref 12–65)
AST: 18 U/L (ref 15–37)
Albumin/Globulin Ratio: 0.9 — ABNORMAL LOW (ref 1.2–3.5)
Albumin: 3.7 g/dL (ref 3.5–5.0)
Alkaline Phosphatase: 103 U/L (ref 50–136)
Anion Gap: 9 mmol/L (ref 7–16)
BUN: 8 MG/DL (ref 6–23)
CO2: 27 mmol/L (ref 21–32)
Calcium: 9.3 MG/DL (ref 8.3–10.4)
Chloride: 104 mmol/L (ref 98–107)
Creatinine: 0.81 MG/DL (ref 0.8–1.5)
EGFR IF NonAfrican American: >60 mL/min/{1.73_m2} (ref >60)
GFR African American: >60 mL/min/{1.73_m2} (ref >60)
Globulin: 4.1 g/dL — ABNORMAL HIGH (ref 2.3–3.5)
Glucose: 118 mg/dL — ABNORMAL HIGH (ref 65–100)
Potassium: 3.6 mmol/L (ref 3.5–5.1)
Sodium: 140 mmol/L (ref 136–145)
Total Bilirubin: 0.4 MG/DL (ref 0.2–1.1)
Total Protein: 7.8 g/dL (ref 6.3–8.2)

## 2019-10-06 LAB — LIPASE
Lipase: 92 U/L (ref 73–393)
Lipase: 92 U/L (ref 73–393)

## 2019-10-06 LAB — METABOLIC PANEL, COMPREHENSIVE
A-G Ratio: 0.9 — ABNORMAL LOW (ref 1.2–3.5)
ALT (SGPT): 35 U/L (ref 12–65)
AST (SGOT): 18 U/L (ref 15–37)
Albumin: 3.7 g/dL (ref 3.5–5.0)
Alk. phosphatase: 103 U/L (ref 50–136)
Anion gap: 9 mmol/L (ref 7–16)
BUN: 8 MG/DL (ref 6–23)
Bilirubin, total: 0.4 MG/DL (ref 0.2–1.1)
CO2: 27 mmol/L (ref 21–32)
Calcium: 9.3 MG/DL (ref 8.3–10.4)
Chloride: 104 mmol/L (ref 98–107)
Creatinine: 0.81 MG/DL (ref 0.8–1.5)
GFR est AA: >60 mL/min/{1.73_m2} (ref >60)
GFR est non-AA: >60 mL/min/{1.73_m2} (ref >60)
Globulin: 4.1 g/dL — ABNORMAL HIGH (ref 2.3–3.5)
Glucose: 118 mg/dL — ABNORMAL HIGH (ref 65–100)
Potassium: 3.6 mmol/L (ref 3.5–5.1)
Protein, total: 7.8 g/dL (ref 6.3–8.2)
Sodium: 140 mmol/L (ref 136–145)

## 2019-10-06 LAB — CBC WITH AUTOMATED DIFF
ABS. BASOPHILS: 0 10*3/uL (ref 0.0–0.2)
ABS. EOSINOPHILS: 0 10*3/uL (ref 0.0–0.8)
ABS. IMM. GRANS.: 0.1 10*3/uL (ref 0.0–0.5)
ABS. LYMPHOCYTES: 1.7 10*3/uL (ref 0.5–4.6)
ABS. MONOCYTES: 0.4 10*3/uL (ref 0.1–1.3)
ABS. NEUTROPHILS: 14.6 10*3/uL — ABNORMAL HIGH (ref 1.7–8.2)
ABSOLUTE NRBC: 0 10*3/uL (ref 0.0–0.2)
BASOPHILS: 0 % (ref 0.0–2.0)
EOSINOPHILS: 0 % — ABNORMAL LOW (ref 0.5–7.8)
HCT: 44.9 % (ref 41.1–50.3)
HGB: 14.9 g/dL (ref 13.6–17.2)
IMMATURE GRANULOCYTES: 1 % (ref 0.0–5.0)
LYMPHOCYTES: 10 % — ABNORMAL LOW (ref 13–44)
MCH: 29.9 PG (ref 26.1–32.9)
MCHC: 33.2 g/dL (ref 31.4–35.0)
MCV: 90 FL (ref 79.6–97.8)
MONOCYTES: 2 % — ABNORMAL LOW (ref 4.0–12.0)
MPV: 10.3 FL (ref 9.4–12.3)
NEUTROPHILS: 87 % — ABNORMAL HIGH (ref 43–78)
PLATELET: 406 10*3/uL (ref 150–450)
RBC: 4.99 M/uL (ref 4.23–5.6)
RDW: 12.8 % (ref 11.9–14.6)
WBC: 16.7 10*3/uL — ABNORMAL HIGH (ref 4.3–11.1)

## 2019-10-06 MED ORDER — DIPHENOXYLATE-ATROPINE 2.5 MG-0.025 MG TAB
ORAL_TABLET | Freq: Four times a day (QID) | ORAL | 0 refills | Status: AC | PRN
Start: 2019-10-06 — End: ?

## 2019-10-06 MED ORDER — DIPHENOXYLATE-ATROPINE 2.5 MG-0.025 MG TAB
ORAL | Status: AC
Start: 2019-10-06 — End: 2019-10-06
  Administered 2019-10-06: 19:00:00 via ORAL

## 2019-10-06 MED ORDER — SODIUM CHLORIDE 0.9 % INJECTION
25 mg/mL | INTRAMUSCULAR | Status: AC
Start: 2019-10-06 — End: 2019-10-06
  Administered 2019-10-06: 19:00:00 via INTRAVENOUS

## 2019-10-06 MED ORDER — ONDANSETRON (PF) 4 MG/2 ML INJECTION
4 mg/2 mL | Freq: Once | INTRAMUSCULAR | Status: AC
Start: 2019-10-06 — End: 2019-10-06
  Administered 2019-10-06: 19:00:00 via INTRAVENOUS

## 2019-10-06 MED ORDER — SODIUM CHLORIDE 0.9% BOLUS IV
0.9 % | Freq: Once | INTRAVENOUS | Status: AC
Start: 2019-10-06 — End: 2019-10-06
  Administered 2019-10-06: 19:00:00 via INTRAVENOUS

## 2019-10-06 MED ORDER — SODIUM CHLORIDE 0.9 % IJ SYRG
Freq: Three times a day (TID) | INTRAMUSCULAR | Status: DC
Start: 2019-10-06 — End: 2019-10-06

## 2019-10-06 MED ORDER — SODIUM CHLORIDE 0.9 % IJ SYRG
INTRAMUSCULAR | Status: DC | PRN
Start: 2019-10-06 — End: 2019-10-06

## 2019-10-06 MED ORDER — PROMETHAZINE 25 MG TAB
25 mg | ORAL_TABLET | Freq: Four times a day (QID) | ORAL | 0 refills | Status: AC | PRN
Start: 2019-10-06 — End: ?

## 2019-10-06 MED FILL — ONDANSETRON (PF) 4 MG/2 ML INJECTION: 4 mg/2 mL | INTRAMUSCULAR | Qty: 2

## 2019-10-06 MED FILL — DIPHENOXYLATE-ATROPINE 2.5 MG-0.025 MG TAB: ORAL | Qty: 2

## 2019-10-06 MED FILL — PROMETHAZINE 25 MG/ML INJECTION: 25 mg/mL | INTRAMUSCULAR | Qty: 1

## 2019-10-06 NOTE — ED Provider Notes (Signed)
39 year old otherwise healthy male presenting for abdominal cramping, nausea, vomiting, diarrhea and lightheadedness.  Reports she just drove down to [place] from [place] and arrived last night.  Overnight he developed the above symptoms.  They have been persistent.  Describes the diarrhea as watery.  No known sick exposures.  He is taken nothing for it.    The history is provided by the patient.   Abdominal Pain   This is a new problem. The current episode started 12 to 24 hours ago. The problem occurs constantly. The problem has not changed since onset.The pain is associated with vomiting. The pain is located in the generalized abdominal region. The quality of the pain is cramping. The pain is at a severity of 7/10. The pain is moderate. Associated symptoms include diarrhea, nausea, vomiting and myalgias. Nothing worsens the pain. The pain is relieved by nothing. Past workup includes no CT scan, no ultrasound, no surgery, no esophagogastroduodenoscopy, no UGI, no colonoscopy and no barium enema. His past medical history does not include PUD, gallstones, GERD, ulcerative colitis, Crohn's disease, irritable bowel syndrome, cancer, UTI, pancreatitis, ectopic pregnancy, ovarian cysts, diverticulitis, atrial fibrillation, DM, kidney stones or small bowel obstruction. The patient's surgical history non-contributory.       No past medical history on file.    No past surgical history on file.      No family history on file.    Social History     Socioeconomic History   ??? Marital status: SINGLE     Spouse name: Not on file   ??? Number of children: Not on file   ??? Years of education: Not on file   ??? Highest education level: Not on file   Occupational History   ??? Not on file   Tobacco Use   ??? Smoking status: Not on file   Substance and Sexual Activity   ??? Alcohol use: Not on file   ??? Drug use: Not on file   ??? Sexual activity: Not on file   Other Topics Concern   ??? Not on file   Social History Narrative   ??? Not  on file     Social Determinants of Health     Financial Resource Strain:    ??? Difficulty of Paying Living Expenses:    Food Insecurity:    ??? Worried About Charity fundraiser in the Last Year:    ??? Arboriculturist in the Last Year:    Transportation Needs:    ??? Film/video editor (Medical):    ??? Lack of Transportation (Non-Medical):    Physical Activity:    ??? Days of Exercise per Week:    ??? Minutes of Exercise per Session:    Stress:    ??? Feeling of Stress :    Social Connections:    ??? Frequency of Communication with Friends and Family:    ??? Frequency of Social Gatherings with Friends and Family:    ??? Attends Religious Services:    ??? Marine scientist or Organizations:    ??? Attends Music therapist:    ??? Marital Status:    Intimate Production manager Violence:    ??? Fear of Current or Ex-Partner:    ??? Emotionally Abused:    ??? Physically Abused:    ??? Sexually Abused:          ALLERGIES: Patient has no allergy information on record.    Review of Systems   Gastrointestinal: Positive  for abdominal pain, diarrhea, nausea and vomiting.   Musculoskeletal: Positive for myalgias.   All other systems reviewed and are negative.      Vitals:    10/06/19 1410   BP: (!) 160/103   Pulse: 62   Resp: 22   Temp: 97.8 ??F (36.6 ??C)   SpO2: 97%            Physical Exam  Vitals and nursing note reviewed.   Constitutional:       Appearance: He is well-developed. He is obese. He is ill-appearing.   HENT:      Head: Normocephalic and atraumatic.   Eyes:      Conjunctiva/sclera: Conjunctivae normal.      Pupils: Pupils are equal, round, and reactive to light.   Cardiovascular:      Rate and Rhythm: Regular rhythm. Tachycardia present.      Heart sounds: Normal heart sounds.   Pulmonary:      Effort: Pulmonary effort is normal.      Breath sounds: Normal breath sounds.   Abdominal:      General: Bowel sounds are normal. There is no distension.      Palpations: Abdomen is soft. There is no mass.      Tenderness: There is no abdominal  tenderness. There is no guarding.      Hernia: No hernia is present.   Musculoskeletal:         General: No deformity. Normal range of motion.      Cervical back: Normal range of motion and neck supple.   Skin:     General: Skin is warm and dry.   Neurological:      Mental Status: He is alert and oriented to person, place, and time.      Cranial Nerves: No cranial nerve deficit.   Psychiatric:         Behavior: Behavior normal.          MDM  Number of Diagnoses or Management Options  Diarrhea, unspecified type  Non-intractable vomiting with nausea, unspecified vomiting type  Diagnosis management comments: 39 year old otherwise healthy male presenting for nausea vomiting diarrhea and abdominal cramping.  Likely viral gastroenteritis but will check basic labs to rule out metabolic issues and dehydration, evaluate for pancreatitis treat with fluids and antiemetic medications.  Patient's abdomen is benign so I doubt that imaging will be required but can be ordered should the patient's blood work revealed significant abnormalities.       Amount and/or Complexity of Data Reviewed  Clinical lab tests: ordered and reviewed (Results for orders placed or performed during the hospital encounter of 10/06/19  -CBC WITH AUTOMATED DIFF       Result                      Value             Ref Range           WBC                         16.7 (H)          4.3 - 11.1 K*       RBC                         4.99              4.23 - 5.6 M*  HGB                         14.9              13.6 - 17.2 *       HCT                         44.9              41.1 - 50.3 %       MCV                         90.0              79.6 - 97.8 *       MCH                         29.9              26.1 - 32.9 *       MCHC                        33.2              31.4 - 35.0 *       RDW                         12.8              11.9 - 14.6 %       PLATELET                    406               150 - 450 K/*       MPV                         10.3               9.4 - 12.3 FL       ABSOLUTE NRBC               0.00              0.0 - 0.2 K/*       DF                          AUTOMATED                             NEUTROPHILS                 87 (H)            43 - 78 %           LYMPHOCYTES                 10 (L)            13 - 44 %           MONOCYTES                   2 (L)  4.0 - 12.0 %        EOSINOPHILS                 0 (L)             0.5 - 7.8 %         BASOPHILS                   0                 0.0 - 2.0 %         IMMATURE GRANULOCYTES       1                 0.0 - 5.0 %         ABS. NEUTROPHILS            14.6 (H)          1.7 - 8.2 K/*       ABS. LYMPHOCYTES            1.7               0.5 - 4.6 K/*       ABS. MONOCYTES              0.4               0.1 - 1.3 K/*       ABS. EOSINOPHILS            0.0               0.0 - 0.8 K/*       ABS. BASOPHILS              0.0               0.0 - 0.2 K/*       ABS. IMM. GRANS.            0.1               0.0 - 0.5 K/*  -METABOLIC PANEL, COMPREHENSIVE       Result                      Value             Ref Range           Sodium                      140               136 - 145 mm*       Potassium                   3.6               3.5 - 5.1 mm*       Chloride                    104               98 - 107 mmo*       CO2                         27                21 - 32 mmol*  Anion gap                   9                 7 - 16 mmol/L       Glucose                     118 (H)           65 - 100 mg/*       BUN                         8                 6 - 23 MG/DL        Creatinine                  0.81              0.8 - 1.5 MG*       GFR est AA                  >60               >60 ml/min/1*       GFR est non-AA              >60               >60 ml/min/1*       Calcium                     9.3               8.3 - 10.4 M*       Bilirubin, total            0.4               0.2 - 1.1 MG*       ALT (SGPT)                  35                12 - 65 U/L         AST (SGOT)                  18                 15 - 37 U/L         Alk. phosphatase            103               50 - 136 U/L        Protein, total              7.8               6.3 - 8.2 g/*       Albumin                     3.7               3.5 - 5.0 g/*       Globulin                    4.1 (H)  2.3 - 3.5 g/*       A-G Ratio                   0.9 (L)           1.2 - 3.5      -MAGNESIUM       Result                      Value             Ref Range           Magnesium                   2.2               1.8 - 2.4 mg*  -LIPASE       Result                      Value             Ref Range           Lipase                      92                73 - 393 U/L   )    Risk of Complications, Morbidity, and/or Mortality  Presenting problems: high  Diagnostic procedures: moderate  Management options: moderate  General comments: Patient's remained hemodynamically stable.  Work-up essentially unremarkable.  Mild elevation white blood cell count but no left shift or bandemia with a benign abdomen and no fever.  Patient feeling better after fluids Phenergan and Lomotil.  Discharging with Phenergan and Lomotil.    I personally reviewed the patient's vital signs, laboratory tests, and/or radiological findings.  I discussed these findings with the patient and their significance.  I answered all questions and gave the patient clear return precautions.  The patient was discharged from the emergency department in stable condition        Patient Progress  Patient progress: improved    ED Course as of Oct 05 1641   Mon Oct 06, 2019   1454 EKG performed shows sinus rhythm rate 57, normal axis, PR is 144, QRS is 78, QTc is 410 no acute ischemic change    [JS]   1641 Patient feeling better and resting comfortably.  Ready for discharge    [JS]      ED Course User Index  [JS] Synetta Fail, MD       Procedures

## 2019-10-06 NOTE — ED Notes (Signed)
 I have reviewed discharge instructions with the patient.  The patient verbalized understanding.    Patient left ED via Discharge Method: ambulatory to Home with spouse .    Opportunity for questions and clarification provided.       Patient given 2 scripts.         To continue your aftercare when you leave the hospital, you may receive an automated call from our care team to check in on how you are doing.  This is a free service and part of our promise to provide the best care and service to meet your aftercare needs." If you have questions, or wish to unsubscribe from this service please call [phone].  Thank you for Choosing our [facility] Emergency Department.

## 2019-10-07 LAB — EKG 12-LEAD
Atrial Rate: 57 {beats}/min
P Axis: 54 degrees
P-R Interval: 144 ms
Q-T Interval: 422 ms
QRS Duration: 78 ms
QTc Calculation (Bazett): 410 ms
R Axis: 47 degrees
T Axis: 19 degrees
Ventricular Rate: 57 {beats}/min

## 2019-10-07 LAB — EKG, 12 LEAD, INITIAL
Atrial Rate: 57 {beats}/min
Calculated P Axis: 54 degrees
Calculated R Axis: 47 degrees
Calculated T Axis: 19 degrees
P-R Interval: 144 ms
Q-T Interval: 422 ms
QRS Duration: 78 ms
QTC Calculation (Bezet): 410 ms
Ventricular Rate: 57 {beats}/min
